# Patient Record
Sex: Male | Born: 1985 | Hispanic: Yes | Marital: Married | Smoking: Current every day smoker
Health system: Southern US, Community
[De-identification: ages and names within clinical notes are randomized; demographics above are authoritative.]

---

## 2010-05-08 ENCOUNTER — Emergency Department (HOSPITAL_COMMUNITY)
Admission: EM | Admit: 2010-05-08 | Discharge: 2010-05-08 | Payer: Self-pay | Source: Home / Self Care | Admitting: Family Medicine

## 2012-08-26 ENCOUNTER — Other Ambulatory Visit: Payer: Self-pay | Admitting: Anesthesiology

## 2012-08-26 ENCOUNTER — Ambulatory Visit
Admission: RE | Admit: 2012-08-26 | Discharge: 2012-08-26 | Disposition: A | Discharge status: Home / Self Care | Payer: Self-pay | Source: Ambulatory Visit | Attending: Anesthesiology | Admitting: Anesthesiology

## 2012-08-26 DIAGNOSIS — M549 Dorsalgia, unspecified: Secondary | ICD-10-CM

## 2018-01-22 ENCOUNTER — Other Ambulatory Visit: Payer: Self-pay

## 2018-01-22 ENCOUNTER — Emergency Department (HOSPITAL_COMMUNITY)
Admission: EM | Admit: 2018-01-22 | Discharge: 2018-01-22 | Disposition: A | Discharge status: Home / Self Care | Payer: Self-pay | Attending: Emergency Medicine | Admitting: Emergency Medicine

## 2018-01-22 ENCOUNTER — Encounter (HOSPITAL_COMMUNITY): Payer: Self-pay | Admitting: Emergency Medicine

## 2018-01-22 DIAGNOSIS — F17210 Nicotine dependence, cigarettes, uncomplicated: Secondary | ICD-10-CM | POA: Insufficient documentation

## 2018-01-22 DIAGNOSIS — R5383 Other fatigue: Secondary | ICD-10-CM | POA: Insufficient documentation

## 2018-01-22 LAB — URINALYSIS, ROUTINE W REFLEX MICROSCOPIC
BILIRUBIN URINE: NEGATIVE
GLUCOSE, UA: NEGATIVE mg/dL
HGB URINE DIPSTICK: NEGATIVE
Ketones, ur: NEGATIVE mg/dL
Leukocytes, UA: NEGATIVE
NITRITE: NEGATIVE
PH: 5 (ref 5.0–8.0)
Protein, ur: NEGATIVE mg/dL
SPECIFIC GRAVITY, URINE: 1.027 (ref 1.005–1.030)

## 2018-01-22 LAB — I-STAT TROPONIN, ED: Troponin i, poc: 0 ng/mL (ref 0.00–0.08)

## 2018-01-22 LAB — CBC
HEMATOCRIT: 49.4 % (ref 39.0–52.0)
HEMOGLOBIN: 16.4 g/dL (ref 13.0–17.0)
MCH: 27.8 pg (ref 26.0–34.0)
MCHC: 33.2 g/dL (ref 30.0–36.0)
MCV: 83.9 fL (ref 78.0–100.0)
Platelets: 315 10*3/uL (ref 150–400)
RBC: 5.89 MIL/uL — ABNORMAL HIGH (ref 4.22–5.81)
RDW: 13 % (ref 11.5–15.5)
WBC: 8.8 10*3/uL (ref 4.0–10.5)

## 2018-01-22 LAB — TSH: TSH: 1.268 u[IU]/mL (ref 0.350–4.500)

## 2018-01-22 LAB — BASIC METABOLIC PANEL
ANION GAP: 7 (ref 5–15)
BUN: 12 mg/dL (ref 6–20)
CO2: 25 mmol/L (ref 22–32)
Calcium: 9.8 mg/dL (ref 8.9–10.3)
Chloride: 106 mmol/L (ref 98–111)
Creatinine, Ser: 0.91 mg/dL (ref 0.61–1.24)
GFR calc Af Amer: >60 mL/min (ref >60)
GFR calc non Af Amer: >60 mL/min (ref >60)
GLUCOSE: 110 mg/dL — AB (ref 70–99)
POTASSIUM: 4.1 mmol/L (ref 3.5–5.1)
Sodium: 138 mmol/L (ref 135–145)

## 2018-01-22 MED ORDER — TRAMADOL HCL 50 MG PO TABS
50.0000 mg | ORAL_TABLET | Freq: Once | ORAL | Status: AC
  Administered 2018-01-22: 50 mg via ORAL
  Filled 2018-01-22: qty 1

## 2018-01-22 MED ORDER — SODIUM CHLORIDE 0.9 % IV BOLUS
1000.0000 mL | Freq: Once | INTRAVENOUS | Status: AC
  Administered 2018-01-22: 1000 mL via INTRAVENOUS

## 2018-01-22 NOTE — ED Notes (Signed)
Pt provided urine cup

## 2018-01-22 NOTE — ED Provider Notes (Signed)
[facility] EMERGENCY DEPARTMENT Provider Note   CSN: [id number] Arrival date & time: 01/22/18  1124   History   Chief Complaint Chief Complaint  Patient presents with  . Fatigue    HPI Douglas Hamilton is a 32 y.o. male significant medical history who presents for evaluation of generalized fatigue.  Patient states this is been going on for 2 days.  Denies aggravating or alleviating factors. Denies fever, chills, cough, hemoptysis, headache, visual changes, facial asymmetry, slurred speech, unilateral weakness, chest pain, shortness of breath, abdominal pain, nausea, vomiting, dysuria. Admits to one episode of nonbloody diarrhea this morning and has had 2-3 episodes of chills and diaphoresis since Monday. Admits to chronic back pain. Denies IVDU, malignancy, bowel or bladder incontinence, saddle paresthesias  HPI  History reviewed. No pertinent past medical history.  There are no active problems to display for this patient.   History reviewed. No pertinent surgical history.      Home Medications    Prior to Admission medications   Not on File    Family History No family history on file.  Social History Social History   Tobacco Use  . Smoking status: Current Every Day Smoker    Packs/day: 0.50    Types: Cigarettes  . Smokeless tobacco: Never Used  Substance Use Topics  . Alcohol use: Not Currently  . Drug use: Never     Allergies   Patient has no known allergies.   Review of Systems Review of Systems  Constitutional: Positive for chills and diaphoresis. Negative for activity change, appetite change, fatigue, fever and unexpected weight change.  Respiratory: Negative for cough, chest tightness, shortness of breath, wheezing and stridor.   Cardiovascular: Negative for chest pain, palpitations and leg swelling.  Gastrointestinal: Negative.   Genitourinary: Negative.   Musculoskeletal: Negative.   Skin: Negative.   Neurological: Positive  for weakness. Negative for dizziness, seizures, facial asymmetry, speech difficulty, light-headedness, numbness and headaches.     Physical Exam Updated Vital Signs BP 122/85 (BP Location: Right Arm)   Pulse 70   Temp 98.7 F (37.1 C) (Oral)   Resp 17   SpO2 99%   Physical Exam Physical Exam  Constitutional: Pt is oriented to person, place, and time. Pt appears well-developed and well-nourished. No distress.  HENT:  Head: Normocephalic and atraumatic.  Mouth/Throat: Oropharynx is clear and moist.  Eyes: Conjunctivae and EOM are normal. Pupils are equal, round, and reactive to light. No scleral icterus.  No horizontal, vertical or rotational nystagmus  Neck: Normal range of motion. Neck supple.  Full active and passive ROM without pain No midline or paraspinal tenderness No nuchal rigidity or meningeal signs  Cardiovascular: Normal rate, regular rhythm and intact distal pulses.   Pulmonary/Chest: Effort normal and breath sounds normal. No respiratory distress. Pt has no wheezes. No rales.  Abdominal: Soft. Bowel sounds are normal. There is no tenderness. There is no rebound and no guarding.  Musculoskeletal: Normal range of motion.  Full range of motion of the T-spine and L-spine with flexion, hyperextension, and lateral flexion. No midline tenderness or stepoffs. No tenderness to palpation of the spinous processes of the T-spine or L-spine. No tenderness to palpation of the paraspinous muscles of the L-spine. Negative straight leg raise. Lymphadenopathy:    No cervical adenopathy.  Neurological: Pt. is alert and oriented to person, place, and time. He has normal reflexes. No cranial nerve deficit.  Exhibits normal muscle tone. Coordination normal.       Patellar  reflexes are 2+ on the right side and 2+ on the left side.      Achilles reflexes are 2+ on the right side and 2+ on the left side. Mental Status:  Alert, oriented, thought content appropriate. Speech fluent without  evidence of aphasia. Able to follow 2 step commands without difficulty.  Cranial Nerves:  II:  Peripheral visual fields grossly normal, pupils equal, round, reactive to light III,IV, VI: ptosis not present, extra-ocular motions intact bilaterally  V,VII: smile symmetric, facial light touch sensation equal VIII: hearing grossly normal bilaterally  IX,X: midline uvula rise  XI: bilateral shoulder shrug equal and strong XII: midline tongue extension  Motor:  5/5 in upper and lower extremities bilaterally including strong and equal grip strength and dorsiflexion/plantar flexion Sensory: Pinprick and light touch normal in all extremities.  Deep Tendon Reflexes: 2+ and symmetric  Cerebellar: normal finger-to-nose with bilateral upper extremities Gait: normal gait and balance CV: distal pulses palpable throughout   Skin: Skin is warm and dry. No rash noted. Pt is not diaphoretic.  Psychiatric: Pt has a normal mood and affect. Behavior is normal. Judgment and thought content normal.  Nursing note and vitals reviewed.   ED Treatments / Results  Labs (all labs ordered are listed, but only abnormal results are displayed) Labs Reviewed  BASIC METABOLIC PANEL - Abnormal; Notable for the following components:      Result Value   Glucose, Bld 110 (*)    All other components within normal limits  CBC - Abnormal; Notable for the following components:   RBC 5.89 (*)    All other components within normal limits  URINALYSIS, ROUTINE W REFLEX MICROSCOPIC  TSH  I-STAT TROPONIN, ED    EKG EKG Interpretation  Date/Time:  Wednesday January 22 2018 15:45:13 EDT Ventricular Rate:  71 PR Interval:    QRS Duration: 84 QT Interval:  376 QTC Calculation: 409 R Axis:   75 Text Interpretation:  Sinus rhythm Borderline ST elevation, anterior leads No previous ECGs available Confirmed by Frederick Peers [phone]) on 01/22/2018 3:51:03 PM   Radiology No results found.  Procedures Procedures (including  critical care time)  Medications Ordered in ED Medications  traMADol (ULTRAM) tablet 50 mg (has no administration in time range)  sodium chloride 0.9 % bolus 1,000 mL (1,000 mLs Intravenous New Bag/Given 01/22/18 1547)     Initial Impression / Assessment and Plan / ED Course  I have reviewed the triage vital signs and the nursing notes as well as past medical history.  Pertinent labs & imaging results that were available during my care of the patient were reviewed by me and considered in my medical decision making (see chart for details).  32 year old otherwise healthy appearing male presents for evaluation of generalized fatigue x2 days.  Nonfocal neuro exam without neuro deficits.  No chest pain, shortness of breath.  Low suspicion for ACS, neurologic condition, infection.  Chronic back pain no neurologic deficits with nonfocal neuro exam.  Pain does not radiate, no numbness or tingling, no history of malignancy, IV drug use, bowel or bladder incontinence, saddle paresthesias.  No concern for cauda equina.  Labs without leukocytosis, TSH within normal limits, troponin negative, urine without signs of infection.  Hemoglobin 16.4.  EKG normal sinus rhythm. I feel patient's symptoms are most likely viral illness, as he has been feeling "rundown" and has been working a lot.  Patient states he feels better after fluids and is ready for dc.  Patient is stable for discharge  at this time.  Discussed with patient strict return precautions.  Patient voiced understanding is agreeable for follow-up.    Final Clinical Impressions(s) / ED Diagnoses   Final diagnoses:  Fatigue, unspecified type    ED Discharge Orders    None       Kenlyn Lose A, PA-C 01/22/18 1702    Little, Ambrose Finland, MD 01/23/18 [phone]

## 2018-01-22 NOTE — Discharge Instructions (Addendum)
You were evaluated today for fatigue.  Your lab work, urine and EKG were normal.  Please follow-up with your primary care provider for unresolved fatigue.  Return to the ED with any new or worsening symptoms such as:  Get help right away if: You feel confused. Your vision is blurry. You feel faint or pass out. You have a severe headache. You have severe abdominal, pelvic, or back pain. You have chest pain, shortness of breath, or an irregular or fast heartbeat. You are unable to urinate or you urinate less than normal. You develop abnormal bleeding, such as bleeding from the rectum, vagina, nose, lungs, or nipples. You vomit blood. You have thoughts about harming yourself or committing suicide. You are worried that you might harm someone else.

## 2018-01-22 NOTE — ED Triage Notes (Signed)
Pt reports feeling fatigued and having diaphoresis sporadically. Denise shortness of breath or chest pains, n/v. Had 1 episode of diarrhea this morning.   Pt is alert and oriented.

## 2018-10-29 ENCOUNTER — Other Ambulatory Visit: Payer: Self-pay | Admitting: Critical Care Medicine

## 2018-10-29 DIAGNOSIS — Z20822 Contact with and (suspected) exposure to covid-19: Secondary | ICD-10-CM

## 2018-11-03 LAB — NOVEL CORONAVIRUS, NAA: SARS-CoV-2, NAA: NOT DETECTED

## 2018-11-04 ENCOUNTER — Other Ambulatory Visit: Payer: Self-pay | Admitting: *Deleted

## 2018-11-04 DIAGNOSIS — Z20822 Contact with and (suspected) exposure to covid-19: Secondary | ICD-10-CM

## 2018-11-09 LAB — NOVEL CORONAVIRUS, NAA: SARS-CoV-2, NAA: NOT DETECTED

## 2019-07-20 ENCOUNTER — Ambulatory Visit
Admission: RE | Admit: 2019-07-20 | Discharge: 2019-07-20 | Disposition: A | Discharge status: Home / Self Care | Payer: Medicaid Other | Source: Ambulatory Visit | Attending: Neurosurgery | Admitting: Neurosurgery

## 2019-07-20 ENCOUNTER — Other Ambulatory Visit: Payer: Self-pay

## 2019-07-20 ENCOUNTER — Other Ambulatory Visit: Payer: Self-pay | Admitting: Neurosurgery

## 2019-07-20 DIAGNOSIS — M542 Cervicalgia: Secondary | ICD-10-CM

## 2019-07-30 ENCOUNTER — Other Ambulatory Visit: Payer: Self-pay

## 2019-07-30 ENCOUNTER — Ambulatory Visit
Admission: RE | Admit: 2019-07-30 | Discharge: 2019-07-30 | Disposition: A | Discharge status: Home / Self Care | Payer: No Typology Code available for payment source | Source: Ambulatory Visit | Attending: Neurosurgery | Admitting: Neurosurgery

## 2019-07-30 ENCOUNTER — Other Ambulatory Visit: Payer: Self-pay | Admitting: Neurosurgery

## 2019-07-30 DIAGNOSIS — G8929 Other chronic pain: Secondary | ICD-10-CM

## 2021-10-19 IMAGING — CR DG LUMBAR SPINE COMPLETE W/ BEND
7 series · 7 of 7 positions shown · non-contrast
Comparison: None.

CLINICAL DATA: Chronic low back pain with right upper leg numbness

EXAM:
LUMBAR SPINE - COMPLETE WITH BENDING VIEWS

[w lumbar spine ap (1 of 3)]
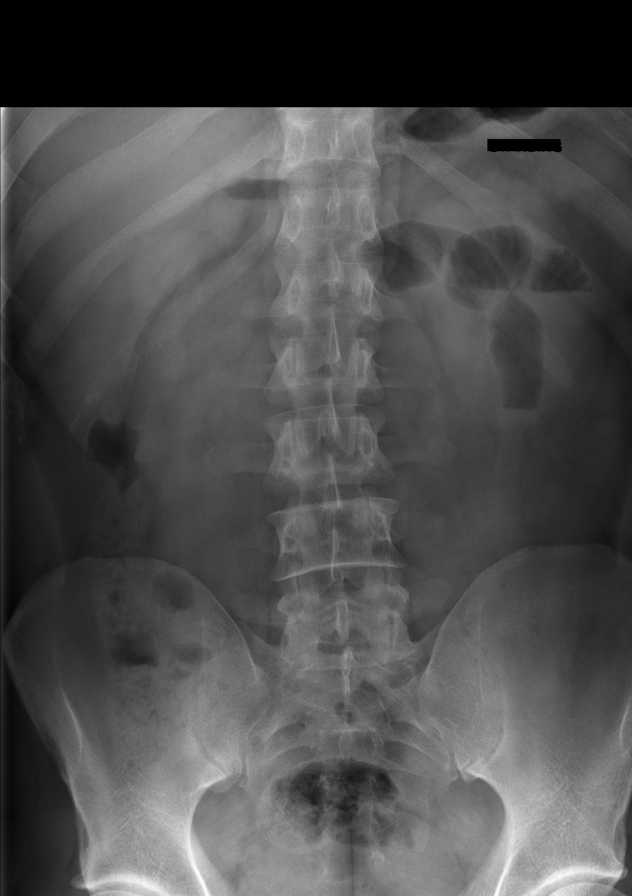

[w lumbar spine lat]
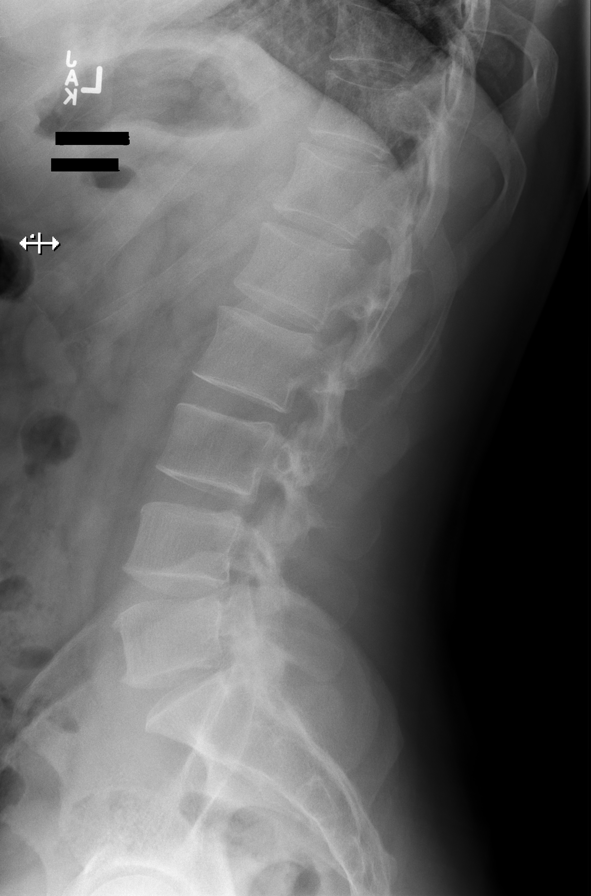

[w lumbar spine bending (1 of 2)]
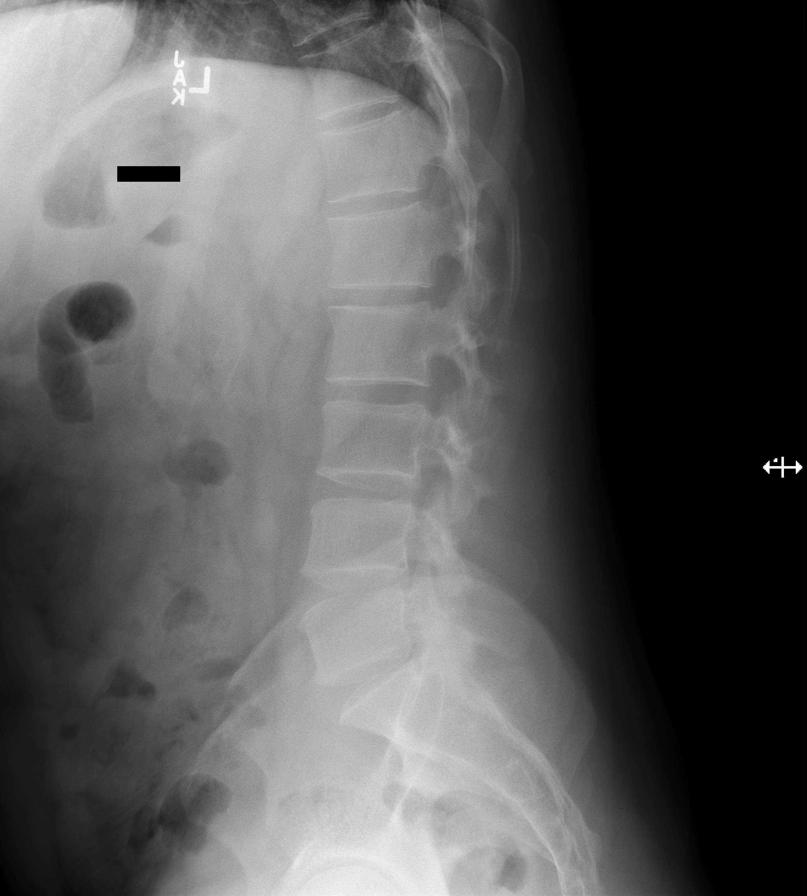

[w lumbar spine bending (2 of 2)]
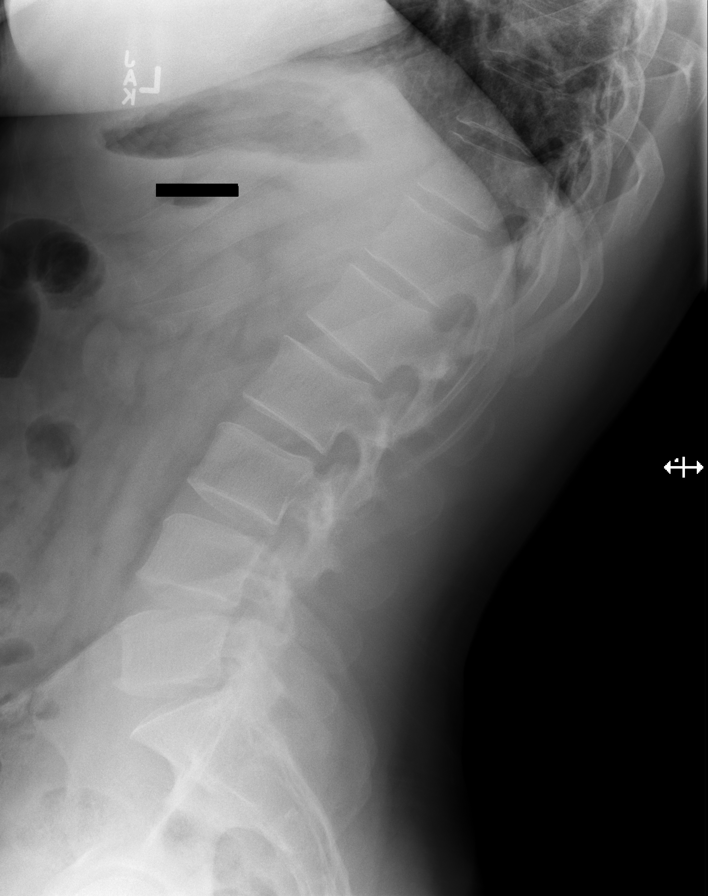

[w lumbar l-5 s-1 spot]
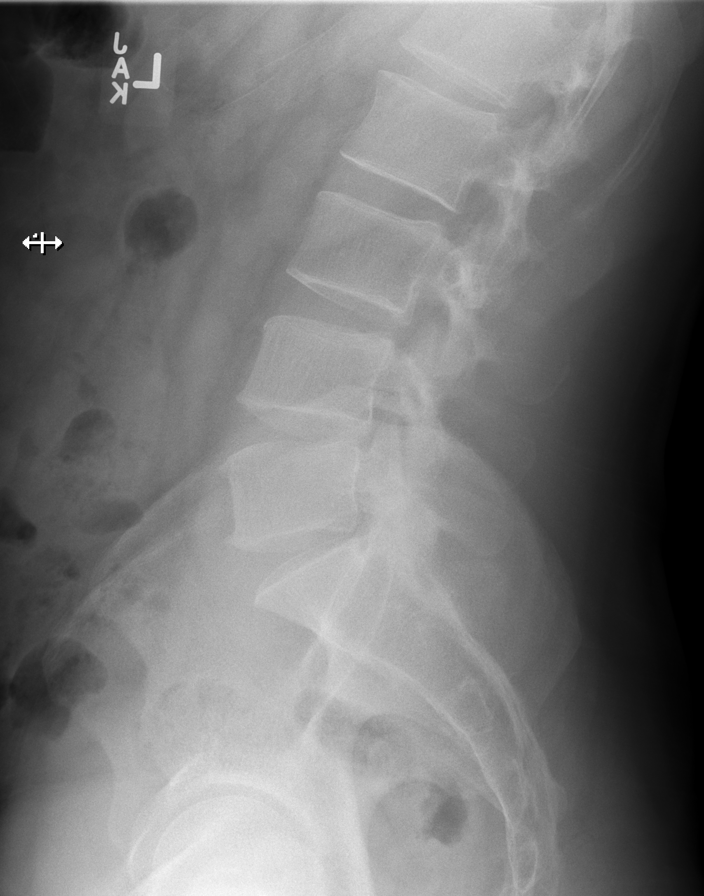

[w lumbar spine ap (2 of 3)]
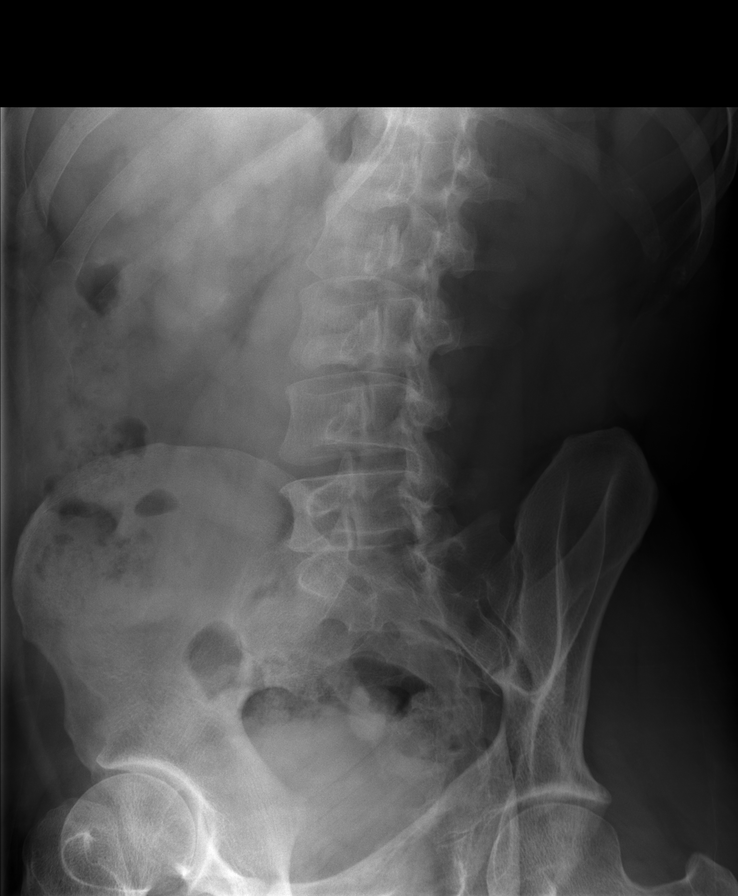

[w lumbar spine ap (3 of 3)]
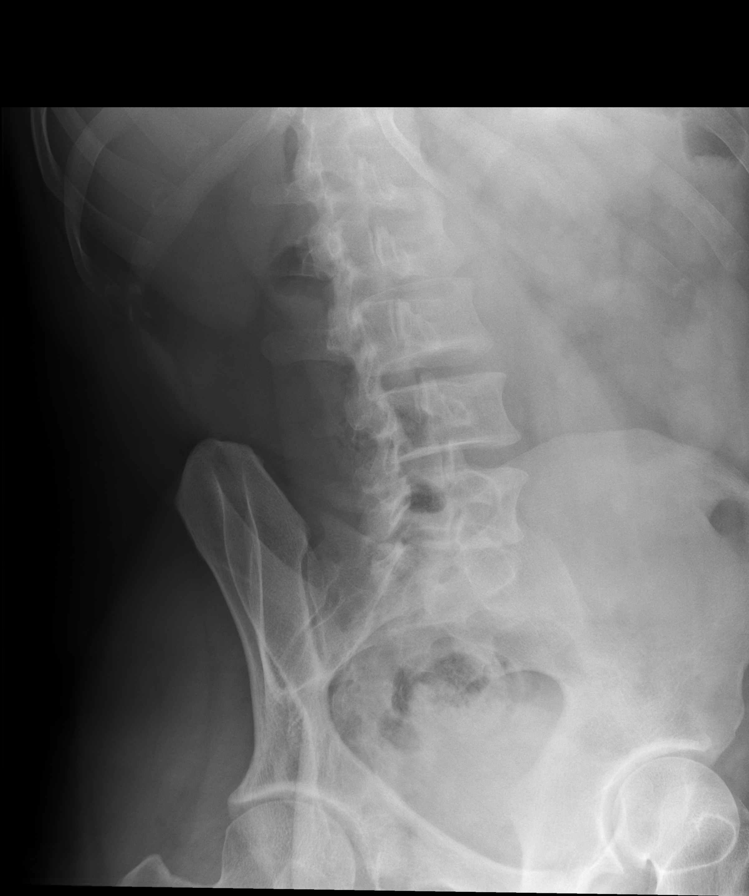

[7 of 7 positions shown; findings below may reference images not displayed]

FINDINGS: There are 5 non-rib-bearing lumbar vertebral bodies in normal
alignment. Views obtained in flexion and extension show no
translational movement. Vertebral body heights and disc spaces are
normal.
IMPRESSION: Normal lumbar spine radiographs. No translational movement on
flexion or extension.

## 2022-01-17 ENCOUNTER — Emergency Department (HOSPITAL_COMMUNITY): Payer: No Typology Code available for payment source

## 2022-01-17 ENCOUNTER — Emergency Department (HOSPITAL_COMMUNITY)
Admission: EM | Admit: 2022-01-17 | Discharge: 2022-01-17 | Disposition: A | Discharge status: Home / Self Care | Payer: No Typology Code available for payment source | Attending: Emergency Medicine | Admitting: Emergency Medicine

## 2022-01-17 ENCOUNTER — Encounter (HOSPITAL_COMMUNITY): Payer: Self-pay | Admitting: *Deleted

## 2022-01-17 DIAGNOSIS — W312XXA Contact with powered woodworking and forming machines, initial encounter: Secondary | ICD-10-CM | POA: Insufficient documentation

## 2022-01-17 DIAGNOSIS — S61012A Laceration without foreign body of left thumb without damage to nail, initial encounter: Secondary | ICD-10-CM | POA: Insufficient documentation

## 2022-01-17 MED ORDER — HYDROCODONE-ACETAMINOPHEN 5-325 MG PO TABS: 1.0000 | ORAL_TABLET | ORAL | 0 refills | Status: AC | PRN

## 2022-01-17 MED ORDER — POVIDONE-IODINE 10 % EX SOLN
CUTANEOUS | Status: DC | PRN
  Filled 2022-01-17: qty 14.8

## 2022-01-17 MED ORDER — LIDOCAINE-EPINEPHRINE (PF) 1 %-1:200000 IJ SOLN
10.0000 mL | Freq: Once | INTRAMUSCULAR | Status: AC
  Administered 2022-01-17: 10 mL
  Filled 2022-01-17: qty 30

## 2022-01-17 MED ORDER — CEPHALEXIN 500 MG PO CAPS: 500.0000 mg | ORAL_CAPSULE | Freq: Four times a day (QID) | ORAL | 0 refills | Status: AC

## 2022-01-17 NOTE — ED Triage Notes (Signed)
Pt with lac to left thumb from a skill saw.  Last tetanus shot 6-7years ago.

## 2022-01-17 NOTE — ED Provider Notes (Signed)
[facility] EMERGENCY DEPARTMENT Provider Note   CSN: [id number] Arrival date & time: 01/17/22  1426     History  Chief Complaint  Patient presents with   Laceration    Douglas Hamilton is a 36 y.o. male presenting with a large laceration to his left nondominant hand which occurred just prior to arrival while using a skill saw at his home.  He denies weakness or numbness in his distal thumb and is able to freely move the thumb without deficit.  He has applied pressure but has been unable to completely eliminate bleeding from the wound site.  He is current with his tetanus vaccine. The history is provided by the patient.       Home Medications Prior to Admission medications   Medication Sig Start Date End Date Taking? Authorizing Provider  cephALEXin (KEFLEX) 500 MG capsule Take 1 capsule (500 mg total) by mouth 4 (four) times daily for 7 days. 01/17/22 01/24/22 Yes Acadia Thammavong, Almyra Free, PA-C  HYDROcodone-acetaminophen (NORCO/VICODIN) 5-325 MG tablet Take 1 tablet by mouth every 4 (four) hours as needed. 01/17/22  Yes Hayly Litsey, Almyra Free, PA-C  cyclobenzaprine (FLEXERIL) 10 MG tablet Take 10 mg by mouth 3 (three) times daily as needed for muscle spasms.    [provider]      Allergies    Patient has no known allergies.    Review of Systems   Review of Systems  Constitutional:  Negative for chills and fever.  Respiratory:  Negative for shortness of breath and wheezing.   Musculoskeletal:  Negative for arthralgias, joint swelling and myalgias.  Skin:  Positive for wound.  Neurological:  Negative for weakness and numbness.    Physical Exam Updated Vital Signs BP (!) 143/95 (BP Location: Right Arm)   Pulse 68   Temp 98.3 F (36.8 C) (Oral)   Resp 18   Ht 5\' 8"  (1.727 m)   Wt 90.7 kg   SpO2 98%   BMI 30.41 kg/m  Physical Exam Constitutional:      Appearance: He is well-developed.  HENT:     Head: Normocephalic.  Cardiovascular:     Rate and Rhythm: Normal rate.   Pulmonary:     Effort: Pulmonary effort is normal.  Skin:    Findings: Laceration present.     Comments: Very irregular shaped laceration with multiple small avulsions and skin flaps on the radial aspect of his left thumb.  The entirety of the wound measures approximately 5 cm.  There is no bone visualized but the wound is through the subcutaneous layer, there is one small section of visualized muscle which appears intact.  Neurological:     Mental Status: He is alert and oriented to person, place, and time.     Sensory: No sensory deficit.     Motor: No weakness.     ED Results / Procedures / Treatments   Labs (all labs ordered are listed, but only abnormal results are displayed) Labs Reviewed - No data to display  EKG None  Radiology DG Finger Thumb Left  Result Date: 01/17/2022 CLINICAL DATA:  Laceration EXAM: LEFT THUMB 2+V COMPARISON:  None Available. FINDINGS: No fracture or malalignment. Laceration at the level of proximal phalanx. No radiopaque foreign body IMPRESSION: No acute osseous abnormality Electronically Signed   By: Donavan Foil M.D.   On: 01/17/2022 16:52    Procedures .Marland KitchenLaceration Repair  Date/Time: 01/17/2022 6:37 PM  Performed by: Evalee Jefferson, PA-C Authorized by: Evalee Jefferson, PA-C   Consent:  Consent obtained:  Verbal   Consent given by:  Patient   Risks, benefits, and alternatives were discussed: yes     Risks discussed:  Infection, pain and poor wound healing Universal protocol:    Patient identity confirmed:  Verbally with patient Laceration details:    Location:  Finger   Finger location:  L thumb   Length (cm):  5 Pre-procedure details:    Preparation:  Patient was prepped and draped in usual sterile fashion and imaging obtained to evaluate for foreign bodies Exploration:    Limited defect created (wound extended): no     Hemostasis achieved with:  Direct pressure and epinephrine   Imaging obtained: x-ray     Imaging outcome: foreign body  not noted     Wound exploration: wound explored through full range of motion and entire depth of wound visualized     Wound extent: no foreign bodies/material noted, no muscle damage noted, no nerve damage noted, no tendon damage noted and no underlying fracture noted   Treatment:    Area cleansed with:  Povidone-iodine and saline   Amount of cleaning:  Extensive   Irrigation solution:  Sterile saline   Irrigation volume:  300   Irrigation method:  Syringe   Debridement:  Moderate   Undermining:  None Skin repair:    Repair method:  Sutures and Steri-Strips   Suture size:  4-0   Suture material:  Nylon   Suture technique:  Simple interrupted   Number of sutures:  15   Number of Steri-Strips:  3 Approximation:    Approximation:  Close Repair type:    Repair type:  Complex Post-procedure details:    Dressing:  Splint for protection, non-adherent dressing and sterile dressing   Procedure completion:  Tolerated well, no immediate complications     Medications Ordered in ED Medications  povidone-iodine (BETADINE) 10 % external solution ( Topical Given 01/17/22 1734)  lidocaine-EPINEPHrine (PF) (XYLOCAINE-EPINEPHrine) 1 %-1:200000 (PF) injection 10 mL (10 mLs Other Given 01/17/22 1733)    ED Course/ Medical Decision Making/ A&P                           Medical Decision Making Patient with a complicated laceration to his left thumb but not affecting any deep structures, he is got full range of motion.  The wound itself was amenable to suturing along the majority of the wound, however there was lots of small flaps that were devitalized and revised.  He is placed on antibiotics prophylactically given the complicated nature of the wound and the mechanism.  He is current with his tetanus so not needed today.  Given referral to Dr. Aline Brochure as needed, advised without complication he should have his sutures removed in 10 days.  Amount and/or Complexity of Data Reviewed Radiology:  ordered.    Details: Reviewed, no fracture.  Risk OTC drugs. Prescription drug management.           Final Clinical Impression(s) / ED Diagnoses Final diagnoses:  Laceration of left thumb without foreign body without damage to nail, initial encounter    Rx / DC Orders ED Discharge Orders          Ordered    HYDROcodone-acetaminophen (NORCO/VICODIN) 5-325 MG tablet  Every 4 hours PRN        01/17/22 1841    cephALEXin (KEFLEX) 500 MG capsule  4 times daily        01/17/22 1841  Evalee Jefferson, PA-C 01/17/22 1846    Dorie Rank, MD 01/19/22 [phone]

## 2022-01-17 NOTE — Discharge Instructions (Addendum)
Have your sutures removed in 10 days.  Keep your wound clean and dry,  Until a good scab forms - you may then wash gently twice daily with mild soap and water, but dry completely after.  Get rechecked for any sign of infection (redness,  Swelling,  Increased pain or drainage of purulent fluid). ° °
